# Patient Record
Sex: Male | Born: 1990 | Race: Black or African American | Hispanic: No | Marital: Single | Smoking: Heavy tobacco smoker
Health system: Southern US, Community
[De-identification: ages and names within clinical notes are randomized; demographics above are authoritative.]

---

## 2008-12-20 ENCOUNTER — Emergency Department: Payer: Self-pay | Admitting: Emergency Medicine

## 2009-03-08 ENCOUNTER — Emergency Department: Payer: Self-pay | Admitting: Emergency Medicine

## 2009-03-14 ENCOUNTER — Emergency Department: Payer: Self-pay | Admitting: Emergency Medicine

## 2010-04-06 ENCOUNTER — Emergency Department: Payer: Self-pay | Admitting: Emergency Medicine

## 2011-03-24 ENCOUNTER — Emergency Department: Payer: Self-pay | Admitting: Emergency Medicine

## 2012-04-30 ENCOUNTER — Emergency Department: Payer: Self-pay | Admitting: Emergency Medicine

## 2012-04-30 LAB — COMPREHENSIVE METABOLIC PANEL
Albumin: 4.5 g/dL (ref 3.4–5.0)
Alkaline Phosphatase: 87 U/L (ref 50–136)
Anion Gap: 8 (ref 7–16)
Bilirubin,Total: 3 mg/dL — ABNORMAL HIGH (ref 0.2–1.0)
Co2: 28 mmol/L (ref 21–32)
Creatinine: 0.97 mg/dL (ref 0.60–1.30)
EGFR (Non-African Amer.): >60
Osmolality: 276 (ref 275–301)
SGOT(AST): 19 U/L (ref 15–37)
SGPT (ALT): 17 U/L

## 2012-04-30 LAB — CBC
HGB: 15.6 g/dL (ref 13.0–18.0)
MCH: 27.5 pg (ref 26.0–34.0)
MCHC: 33.5 g/dL (ref 32.0–36.0)
MCV: 82 fL (ref 80–100)
RDW: 13.3 % (ref 11.5–14.5)
WBC: 15.2 10*3/uL — ABNORMAL HIGH (ref 3.8–10.6)

## 2012-04-30 LAB — URINALYSIS, COMPLETE
Bacteria: NONE SEEN
Bilirubin,UR: NEGATIVE
Glucose,UR: NEGATIVE mg/dL (ref 0–75)
Leukocyte Esterase: NEGATIVE
Nitrite: NEGATIVE
Ph: 5 (ref 4.5–8.0)
Protein: 30
RBC,UR: 6 /HPF (ref 0–5)
Specific Gravity: 1.032 (ref 1.003–1.030)
Squamous Epithelial: <1
WBC UR: NONE SEEN /HPF (ref 0–5)

## 2012-05-01 ENCOUNTER — Observation Stay: Payer: Self-pay | Admitting: Surgery

## 2012-05-01 LAB — COMPREHENSIVE METABOLIC PANEL
Anion Gap: 8 (ref 7–16)
BUN: 16 mg/dL (ref 7–18)
Bilirubin,Total: 2.3 mg/dL — ABNORMAL HIGH (ref 0.2–1.0)
Calcium, Total: 8.6 mg/dL (ref 8.5–10.1)
Chloride: 103 mmol/L (ref 98–107)
Co2: 28 mmol/L (ref 21–32)
EGFR (African American): >60
EGFR (Non-African Amer.): >60
Osmolality: 279 (ref 275–301)
Potassium: 3.6 mmol/L (ref 3.5–5.1)
SGOT(AST): 20 U/L (ref 15–37)
SGPT (ALT): 17 U/L
Total Protein: 7.3 g/dL (ref 6.4–8.2)

## 2012-05-01 LAB — URINALYSIS, COMPLETE
Bilirubin,UR: NEGATIVE
Blood: NEGATIVE
Glucose,UR: NEGATIVE mg/dL (ref 0–75)
Leukocyte Esterase: NEGATIVE
Nitrite: NEGATIVE
Ph: 6 (ref 4.5–8.0)
RBC,UR: 6 /HPF (ref 0–5)
Squamous Epithelial: <1
WBC UR: 2 /HPF (ref 0–5)

## 2012-05-01 LAB — CBC
HCT: 43 % (ref 40.0–52.0)
HGB: 14.4 g/dL (ref 13.0–18.0)
MCH: 27.3 pg (ref 26.0–34.0)
MCHC: 33.4 g/dL (ref 32.0–36.0)
MCV: 82 fL (ref 80–100)
Platelet: 149 10*3/uL — ABNORMAL LOW (ref 150–440)
RBC: 5.26 10*6/uL (ref 4.40–5.90)
WBC: 8 10*3/uL (ref 3.8–10.6)
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
## 2015-03-10 NOTE — H&P (Signed)
History of Present Illness 21 yobm was awoken with generalized dull mid-abdominal pain at 1030 yesterday AM. Worse after stepping and c/o bladder pressure after voiding. Vomited once last PM, seen in ER, and pain resolved with Zofran. Had diarrhea last PM and again this AM and c/o gas 'rumblings' and a mild H/A. Has had fever. Ate 2 slices of pizza at 3PM today and has no loss of appetite. CT last PM was 'overread' today as 16 mm appendix with stranding in R suprapubic region.   Past Med/Surgical Hx:  seasonal allergies:   denies:   ALLERGIES:  No Known Allergies:   HOME MEDICATIONS: Medication Instructions Status  ondansetron 4 mg oral tablet, disintegrating 1  orally every 8 hours as needed for nausea. 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, drives truck   Review of Systems:   Fever/Chills Yes    Cough No    Sputum No    Abdominal Pain Yes    Diarrhea Yes    Constipation No    Nausea/Vomiting Yes    SOB/DOE No    Chest Pain No    Dysuria Yes    Tolerating PT Yes    Tolerating Diet Yes    Medications/Allergies Reviewed Medications/Allergies reviewed   Physical Exam:   GEN well developed, well nourished, thin    HEENT pink conjunctivae, hearing intact to voice, moist oral mucosa, good dentition    NECK supple  No masses  trachea midline    RESP normal resp effort  clear BS  no use of accessory muscles    CARD regular rate  no murmur  no Rub    ABD positive tenderness  mild rebound and guarding in R suprapubic region    GU superpubic tenderness    EXTR negative cyanosis/clubbing, negative edema    SKIN No rashes, No ulcers, skin turgor good    NEURO cranial nerves intact, follows commands, motor/sensory function intact    PSYCH alert, A+O  to time, place, person, good insight   Radiology Results: CT:    15-Jun-13 23:27, CT Abdomen and Pelvis With Contrast   CT Abdomen and Pelvis With Contrast   REASON FOR EXAM:    (1) RLQ pain; (2) RLQ pain  COMMENTS:       PROCEDURE: CT  - CT ABDOMEN / PELVIS  W  - Apr 30 2012 11:27PM     RESULT: Axial CT scanning was performed through the abdomen and pelvis   with reconstructions at 3 mm intervals and slice thicknesses. Review of   multiplanar reconstructed images was performed separately on the VIA   monitor. The patient received oral contrast as well as 100 of Isovue 300.    The partially contrast-filled loops of small and large bowel exhibit no   evidence of ileus nor obstruction. There is contrast present within the   proximal portion of the appendix but there is dilation of the mid and   distal portions of the appendix consistent with acute appendicitis. This   is demonstrated on images 87-103. I do not see evidence of an abscess.     There is a small amount of free pelvic fluid.    The kidneys enhance well. There is no evidence of stones  or obstruction.   Along the expected course of the ureters I do not see abnormalities. The   partially distended urinary bladder is normal in appearance. The prostate   gland measures 4.7 cm in diameter is grossly normal in appearance. No   bladder stone or stone within the visualized portions of the urethra are   seen.    The liver, spleen, moderately distended stomach, gallbladder, pancreas,   adrenal glands, and periaortic and pericaval regions are normal in   appearance. The caliber of the abdominal aorta is normal.     The lung bases are clear. The lumbar vertebral bodies are preserved in   height.  IMPRESSION:   1. There are findings consistent with acute appendicitis. I do not see   evidence of abscess. There is a small amount of free fluid in the pelvis.   There is no evidence of bowel obstruction. Close clinical correlation and    surgical consultation is recommended.  2. I see no acute urinary tract abnormality nor acute hepatobiliary   abnormality.    A preliminary report was sent to the emergency department at the   conclusion of the study. This preliminary report did not suggest acute   appendicitis. Upon subsequent final dictation of this report at 6:40 a.m.   on 01 May 2012, this report was called by me to the emergency department   and the report given to Victorino Dike, RN, the ER charge nurse, who will have   the patient recalled.(*)      Verified By: DAVID A. [place], M.D., MD     Assessment/Admission Diagnosis Early acute appendicitis, possibly related to gastroenteritis    Plan Lap Appy   Electronic Signatures: Claude Manges (MD)  (Signed 16-Jun-13 17:02)  Authored: CHIEF COMPLAINT and HISTORY, PAST MEDICAL/SURGIAL HISTORY, ALLERGIES, HOME MEDICATIONS, FAMILY AND SOCIAL HISTORY, REVIEW OF SYSTEMS, PHYSICAL EXAM, Radiology, ASSESSMENT AND PLAN   Last Updated: 16-Jun-13 17:02 by Claude Manges (MD)

## 2015-03-10 NOTE — Discharge Summary (Signed)
PATIENT NAME:  Lee Silva, Lee Silva MR#:  [id number] DATE OF BIRTH:  [birth year]  DATE OF ADMISSION:  05/01/2012 DATE OF DISCHARGE:  05/02/2012  FINAL DIAGNOSIS: Abdominal pain, gastroenteritis likely resolved.   PRINCIPLE PROCEDURE: CT scan of the abdomen and pelvis.   HOSPITAL COURSE SUMMARY: The patient initially was felt to have acute appendicitis. He did eat at 3 o'clock the day of admission and this delayed his surgery until later that evening. Upon evaluation of the patient, the patient was in good spirits, was having no abdominal pain and was having only mild amount of pain in the right lower quadrant. As such, his surgery was delayed until the next morning. Upon re-evaluation, the patient was absolutely symptom-free. He had eaten three medium pizzas the night before. Examination was unremarkable. As such, the patient was discharged home from the PAC-U without intervention being performed in the form of laparoscopic appendectomy. He was instructed follow-up with Dr. Michela PitcherEly tomorrow in the office.   ____________________________ Redge GainerMark A. Egbert GaribaldiBird, MD mab:drc D: 05/05/2012 23:21:24 ET T: 05/06/2012 12:04:42 ET JOB#: [id number]  cc: Loraine LericheMark A. Egbert GaribaldiBird, MD, <Dictator> Raynald KempMARK A Teighlor Korson MD ELECTRONICALLY SIGNED 05/13/2012 17:59

## 2016-06-26 ENCOUNTER — Encounter: Payer: Self-pay | Admitting: Emergency Medicine

## 2016-06-26 ENCOUNTER — Emergency Department: Payer: No Typology Code available for payment source

## 2016-06-26 ENCOUNTER — Emergency Department
Admission: EM | Admit: 2016-06-26 | Discharge: 2016-06-26 | Disposition: A | Discharge status: Home / Self Care | Payer: No Typology Code available for payment source | Attending: Emergency Medicine | Admitting: Emergency Medicine

## 2016-06-26 DIAGNOSIS — M791 Myalgia: Secondary | ICD-10-CM | POA: Insufficient documentation

## 2016-06-26 DIAGNOSIS — M7918 Myalgia, other site: Secondary | ICD-10-CM

## 2016-06-26 DIAGNOSIS — M25561 Pain in right knee: Secondary | ICD-10-CM | POA: Diagnosis present

## 2016-06-26 DIAGNOSIS — Y92410 Unspecified street and highway as the place of occurrence of the external cause: Secondary | ICD-10-CM | POA: Insufficient documentation

## 2016-06-26 DIAGNOSIS — Y9389 Activity, other specified: Secondary | ICD-10-CM | POA: Diagnosis not present

## 2016-06-26 DIAGNOSIS — Y999 Unspecified external cause status: Secondary | ICD-10-CM | POA: Diagnosis not present

## 2016-06-26 MED ORDER — IBUPROFEN 800 MG PO TABS: 800.0000 mg | ORAL_TABLET | Freq: Three times a day (TID) | ORAL | 0 refills | Status: DC | PRN
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
## 2016-07-12 ENCOUNTER — Emergency Department
Admission: EM | Admit: 2016-07-12 | Discharge: 2016-07-12 | Disposition: A | Discharge status: Home / Self Care | Payer: No Typology Code available for payment source | Attending: Emergency Medicine | Admitting: Emergency Medicine

## 2016-07-12 DIAGNOSIS — H6123 Impacted cerumen, bilateral: Secondary | ICD-10-CM

## 2016-07-12 DIAGNOSIS — F17200 Nicotine dependence, unspecified, uncomplicated: Secondary | ICD-10-CM | POA: Diagnosis not present

## 2016-07-12 DIAGNOSIS — S8001XD Contusion of right knee, subsequent encounter: Secondary | ICD-10-CM | POA: Insufficient documentation

## 2016-07-12 DIAGNOSIS — M25561 Pain in right knee: Secondary | ICD-10-CM

## 2016-07-12 MED ORDER — CYCLOBENZAPRINE HCL 5 MG PO TABS: 5.0000 mg | ORAL_TABLET | Freq: Three times a day (TID) | ORAL | 0 refills | Status: DC | PRN

## 2016-07-12 MED ORDER — CARBAMIDE PEROXIDE 6.5 % OT SOLN
5.0000 [drp] | Freq: Once | OTIC | Status: AC
  Administered 2016-07-12: 5 [drp] via OTIC
  Filled 2016-07-12: qty 15

## 2016-07-12 MED ORDER — IBUPROFEN 800 MG PO TABS: 800.0000 mg | ORAL_TABLET | Freq: Three times a day (TID) | ORAL | 0 refills | Status: DC | PRN

## 2016-07-12 NOTE — Discharge Instructions (Signed)
Your knee exam does not show any signs of internal tears or tendon ruptures. You should continue the ibuprofen and Tylenol as needed for pain. Follow-up with one of the local community clinics or Dr. Martha ClanKrasinski as needed for ongoing symptoms. Apply ice to reduce symptoms. Return to the ED for severely worsening symptoms.
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
## 2018-02-19 ENCOUNTER — Encounter: Payer: Self-pay | Admitting: Emergency Medicine

## 2018-02-19 ENCOUNTER — Other Ambulatory Visit: Payer: Self-pay

## 2018-02-19 ENCOUNTER — Emergency Department: Payer: Self-pay

## 2018-02-19 ENCOUNTER — Emergency Department
Admission: EM | Admit: 2018-02-19 | Discharge: 2018-02-19 | Disposition: A | Discharge status: Home / Self Care | Payer: Self-pay | Attending: Internal Medicine | Admitting: Internal Medicine

## 2018-02-19 DIAGNOSIS — Z79899 Other long term (current) drug therapy: Secondary | ICD-10-CM | POA: Insufficient documentation

## 2018-02-19 DIAGNOSIS — Y999 Unspecified external cause status: Secondary | ICD-10-CM | POA: Insufficient documentation

## 2018-02-19 DIAGNOSIS — F17200 Nicotine dependence, unspecified, uncomplicated: Secondary | ICD-10-CM | POA: Insufficient documentation

## 2018-02-19 DIAGNOSIS — Y929 Unspecified place or not applicable: Secondary | ICD-10-CM | POA: Insufficient documentation

## 2018-02-19 DIAGNOSIS — Y93E6 Activity, residential relocation: Secondary | ICD-10-CM | POA: Insufficient documentation

## 2018-02-19 DIAGNOSIS — W228XXA Striking against or struck by other objects, initial encounter: Secondary | ICD-10-CM | POA: Insufficient documentation

## 2018-02-19 DIAGNOSIS — S8001XA Contusion of right knee, initial encounter: Secondary | ICD-10-CM | POA: Insufficient documentation

## 2018-02-19 DIAGNOSIS — S80211A Abrasion, right knee, initial encounter: Secondary | ICD-10-CM | POA: Insufficient documentation

## 2018-02-19 MED ORDER — CYCLOBENZAPRINE HCL 5 MG PO TABS: 5.0000 mg | ORAL_TABLET | Freq: Three times a day (TID) | ORAL | 0 refills | Status: DC | PRN

## 2018-02-19 MED ORDER — BACITRACIN ZINC 500 UNIT/GM EX OINT
TOPICAL_OINTMENT | CUTANEOUS | Status: AC
  Filled 2018-02-19: qty 0.9

## 2018-02-19 MED ORDER — IBUPROFEN 800 MG PO TABS
800.0000 mg | ORAL_TABLET | Freq: Once | ORAL | Status: AC
  Administered 2018-02-19: 800 mg via ORAL
  Filled 2018-02-19: qty 1

## 2018-02-19 MED ORDER — BACITRACIN ZINC 500 UNIT/GM EX OINT
TOPICAL_OINTMENT | Freq: Once | CUTANEOUS | Status: AC
  Administered 2018-02-19: 1 via TOPICAL
  Filled 2018-02-19: qty 0.9

## 2018-02-19 NOTE — ED Triage Notes (Signed)
Metal rack fell on R knee couple hours ago. Painful since.

## 2018-02-19 NOTE — Discharge Instructions (Signed)
Your exam and x-ray are negative for fracture or dislocation. You have a knee contusion with abrasions. Keep the wounds clean and covered with antibiotic. Take OTC ibuprofen for non-drowsy pain relief. Rest with leg elevated and apply ice to reduce swelling.

## 2018-02-19 NOTE — ED Provider Notes (Signed)
[facility] Emergency Department Provider Note ____________________________________________  Time seen: 1231  I have reviewed the triage vital signs and the nursing notes.  HISTORY  Chief Complaint  Knee Pain  HPI Lee Silva is a 27 y.o. male resents to the ED for pain to the right knee after a contusion.  Patient describes he was helping a friend move some furniture when a large metal rack fell across his right knee a few hours prior to arrival.  He presents with pain and tenderness as well as some abrasions to the proximal aspect of the knee.  He denies any other injury at this time.  He reports pain is increased with flexion of the knee due to the contusion over the quadriceps muscle.  History reviewed. No pertinent past medical history.  There are no active problems to display for this patient.   History reviewed. No pertinent surgical history.  Prior to Admission medications   Medication Sig Start Date End Date Taking? Authorizing Provider  cyclobenzaprine (FLEXERIL) 5 MG tablet Take 1 tablet (5 mg total) by mouth 3 (three) times daily as needed for muscle spasms. 02/19/18   Homero Hyson, Charlesetta Ivory, PA-C  ibuprofen (ADVIL,MOTRIN) 800 MG tablet Take 1 tablet (800 mg total) by mouth every 8 (eight) hours as needed. 06/26/16   Beers, Charmayne Sheer, PA-C  ibuprofen (ADVIL,MOTRIN) 800 MG tablet Take 1 tablet (800 mg total) by mouth every 8 (eight) hours as needed. 07/12/16   Iliani Vejar, Charlesetta Ivory, PA-C    Allergies Patient has no known allergies.  No family history on file.  Social History Social History   Tobacco Use  . Smoking status: Heavy Tobacco Smoker    Packs/day: 1.00    Years: 6.00    Pack years: 6.00  . Smokeless tobacco: Never Used  Substance Use Topics  . Alcohol use: No  . Drug use: No    Review of Systems  Constitutional: Negative for fever. Cardiovascular: Negative for chest pain. Respiratory: Negative for shortness of  breath. Musculoskeletal: Negative for back pain.  Right knee pain as above. Skin: Negative for rash. Neurological: Negative for headaches, focal weakness or numbness. ____________________________________________  PHYSICAL EXAM:  VITAL SIGNS: ED Triage Vitals  Enc Vitals Group     BP 02/19/18 1120 (!) 104/44     Pulse Rate 02/19/18 1120 74     Resp 02/19/18 1120 20     Temp 02/19/18 1120 98.3 F (36.8 C)     Temp Source 02/19/18 1120 Oral     SpO2 02/19/18 1120 98 %     Weight 02/19/18 1121 150 lb (68 kg)     Height 02/19/18 1121 6' (1.829 m)     Head Circumference --      Peak Flow --      Pain Score 02/19/18 1121 3     Pain Loc --      Pain Edu? --      Excl. in GC? --     Constitutional: Alert and oriented. Well appearing and in no distress. Head: Normocephalic and atraumatic. Cardiovascular: Normal rate, regular rhythm. Normal distal pulses. Respiratory: Normal respiratory effort. No wheezes/rales/rhonchi. Musculoskeletal: Right knee without obvious deformity, dislocation, or effusion.  The abrasions are noted to the superior pole of the kneecap at the quadriceps musculature.  The patient with normal flexion & extension range, limited only secondary to some tenderness across the distal quadricep muscle.  No valgus or varus joint stress is appreciated.  The patella tracks  normally without laxity or ballottement.  No popliteal space fullness is noted.  Nontender with normal range of motion in all other extremities.  Neurologic: Antalgic gait without ataxia. Normal speech and language. No gross focal neurologic deficits are appreciated. Skin:  Skin is warm, dry and intact. No rash noted.  Abrasions as noted above. ___________________________________________   RADIOLOGY  Right Knee  Negative  ____________________________________________  PROCEDURES  IBU 800 mg PO Wounds cleaned and dressed antibiotic ointment  Telfa.  Ace bandage is applied for  support. ____________________________________________  INITIAL IMPRESSION / ASSESSMENT AND PLAN / ED COURSE  Patient with ED evaluation right knee contusion and abrasion.  He is reassured by his negative x-ray.  Exam is consistent with some soft tissue pain over the proximal thigh and superior knee.  Patient is discharged with a prescription for Flexeril and a work note is provided for 2 days as requested. ____________________________________________  FINAL CLINICAL IMPRESSION(S) / ED DIAGNOSES  Final diagnoses:  Contusion of right knee, initial encounter  Knee abrasion, right, initial encounter      Lissa HoardMenshew, Ryiah Bellissimo V Bacon, PA-C 02/19/18 1920    Emily FilbertWilliams, Jonathan E, MD 02/22/18 [phone]

## 2018-07-11 ENCOUNTER — Encounter: Payer: Self-pay | Admitting: Emergency Medicine

## 2018-07-11 ENCOUNTER — Emergency Department: Payer: No Typology Code available for payment source

## 2018-07-11 ENCOUNTER — Emergency Department
Admission: EM | Admit: 2018-07-11 | Discharge: 2018-07-11 | Disposition: A | Discharge status: Home / Self Care | Payer: No Typology Code available for payment source | Attending: Emergency Medicine | Admitting: Emergency Medicine

## 2018-07-11 DIAGNOSIS — M25562 Pain in left knee: Secondary | ICD-10-CM | POA: Insufficient documentation

## 2018-07-11 DIAGNOSIS — F17210 Nicotine dependence, cigarettes, uncomplicated: Secondary | ICD-10-CM | POA: Insufficient documentation

## 2018-07-11 DIAGNOSIS — Z4802 Encounter for removal of sutures: Secondary | ICD-10-CM | POA: Diagnosis present

## 2018-07-11 DIAGNOSIS — S8002XA Contusion of left knee, initial encounter: Secondary | ICD-10-CM

## 2018-07-11 MED ORDER — BACITRACIN ZINC 500 UNIT/GM EX OINT
TOPICAL_OINTMENT | Freq: Once | CUTANEOUS | Status: AC
  Administered 2018-07-11: 1 via TOPICAL

## 2018-07-11 MED ORDER — NABUMETONE 750 MG PO TABS: 750.0000 mg | ORAL_TABLET | Freq: Two times a day (BID) | ORAL | 0 refills | Status: AC

## 2018-07-11 MED ORDER — CYCLOBENZAPRINE HCL 5 MG PO TABS
5.0000 mg | ORAL_TABLET | Freq: Three times a day (TID) | ORAL | 0 refills | Status: AC | PRN
Start: 2018-07-11 — End: ?
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
## 2019-09-28 ENCOUNTER — Telehealth: Payer: Self-pay | Admitting: Family Medicine

## 2019-09-28 NOTE — Telephone Encounter (Signed)
Pls call me to set up my monthly meds visits

## 2020-03-21 IMAGING — DX DG KNEE COMPLETE 4+V*R*
4 series · 4 of 4 positions shown · non-contrast
Comparison: 06/26/2016

CLINICAL DATA: "[place]" fell onto pt's right knee, multiple
lacerations right knee, painful to full extend or flex.

EXAM:
RIGHT KNEE - COMPLETE 4+ VIEW

[knee ap]
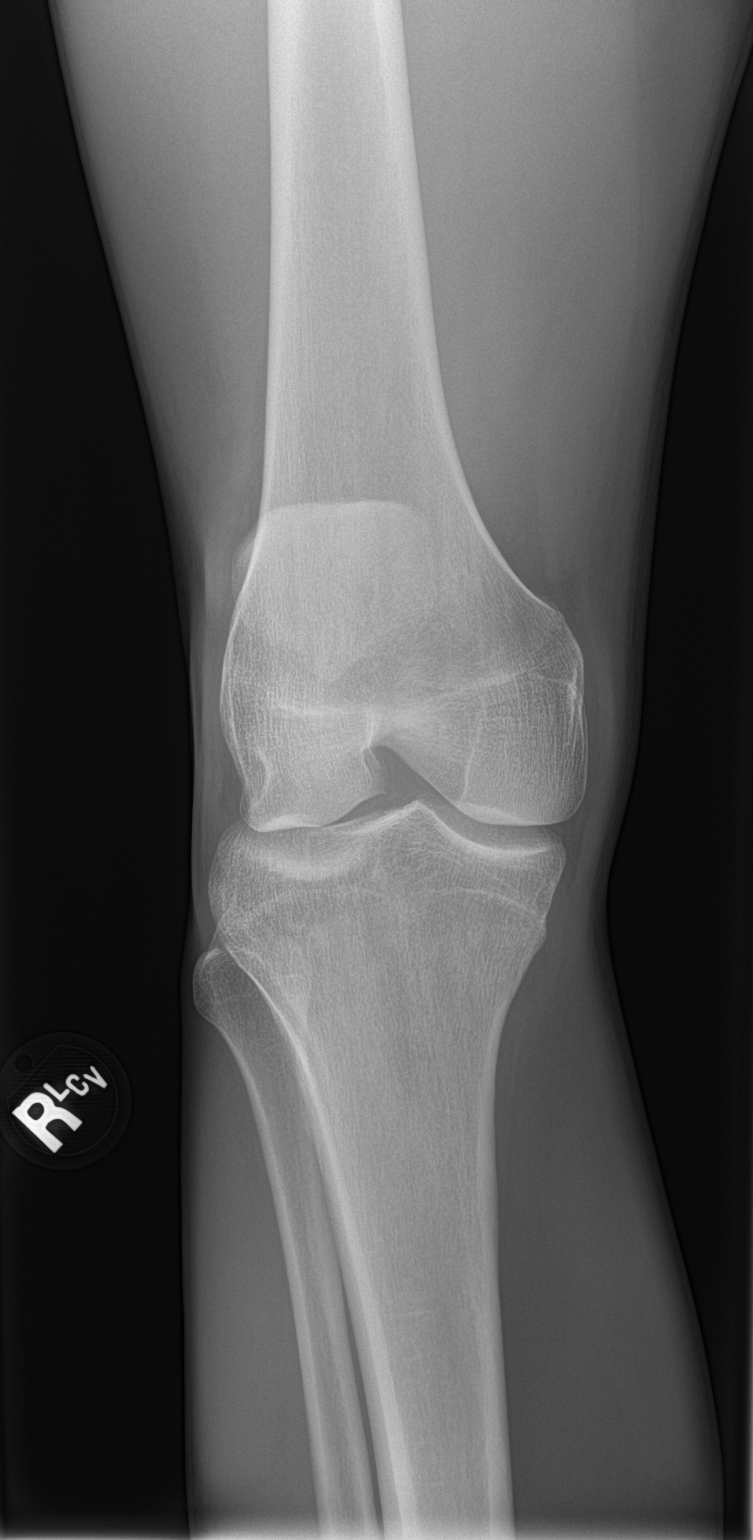

[knee lat]
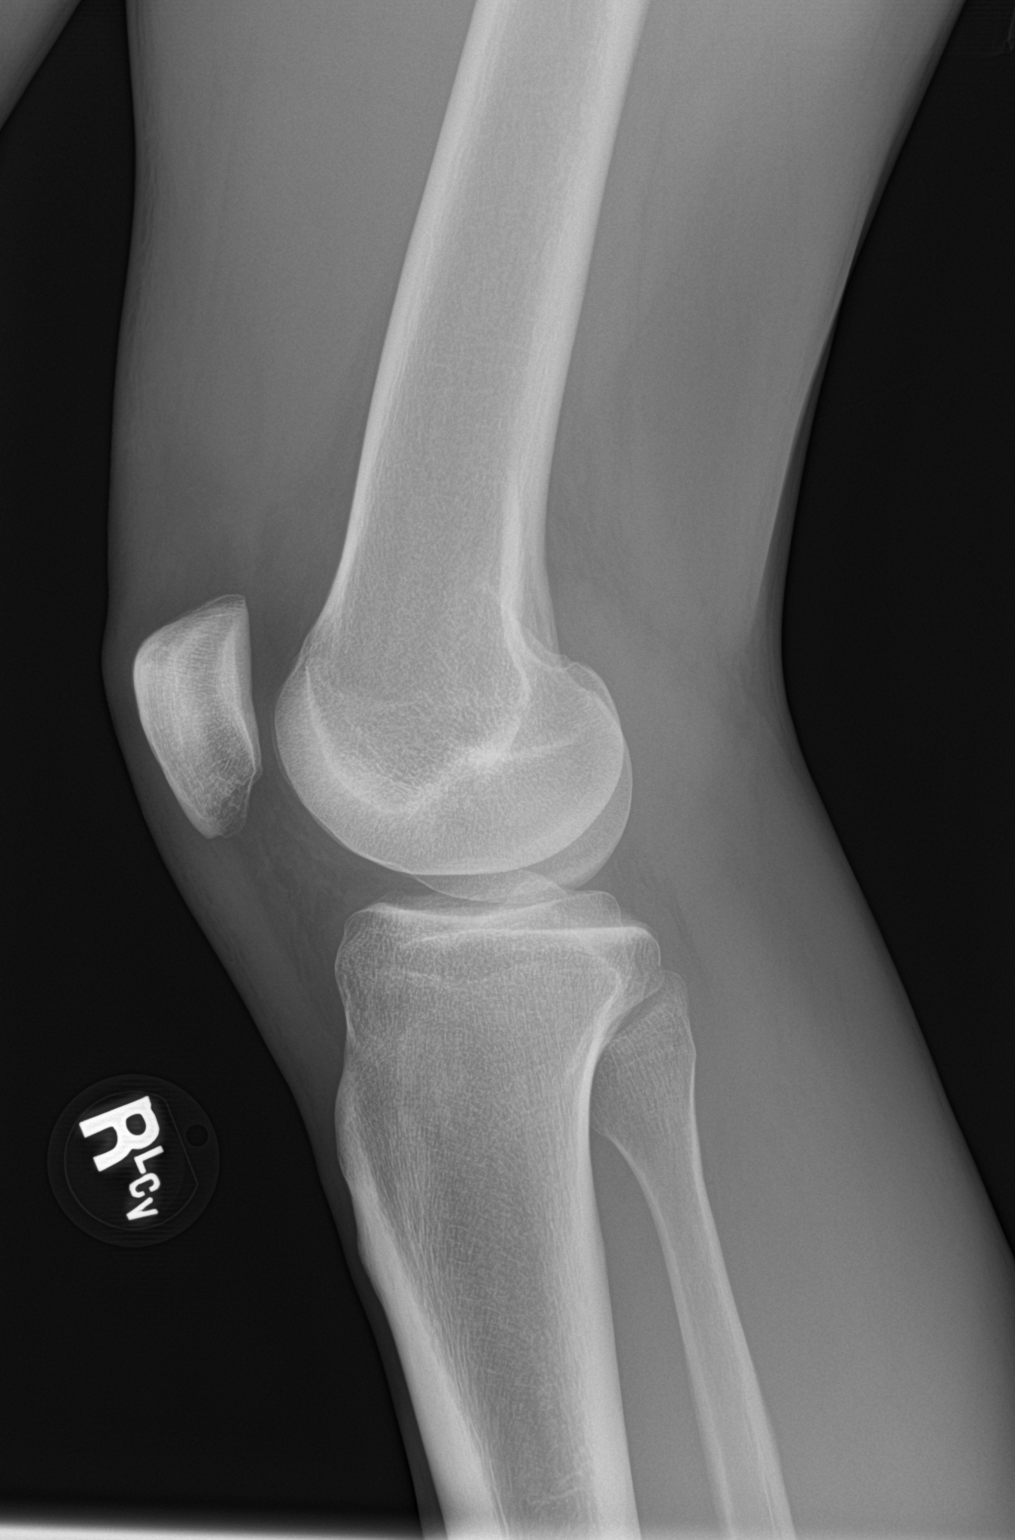

[knee obl (1 of 2)]
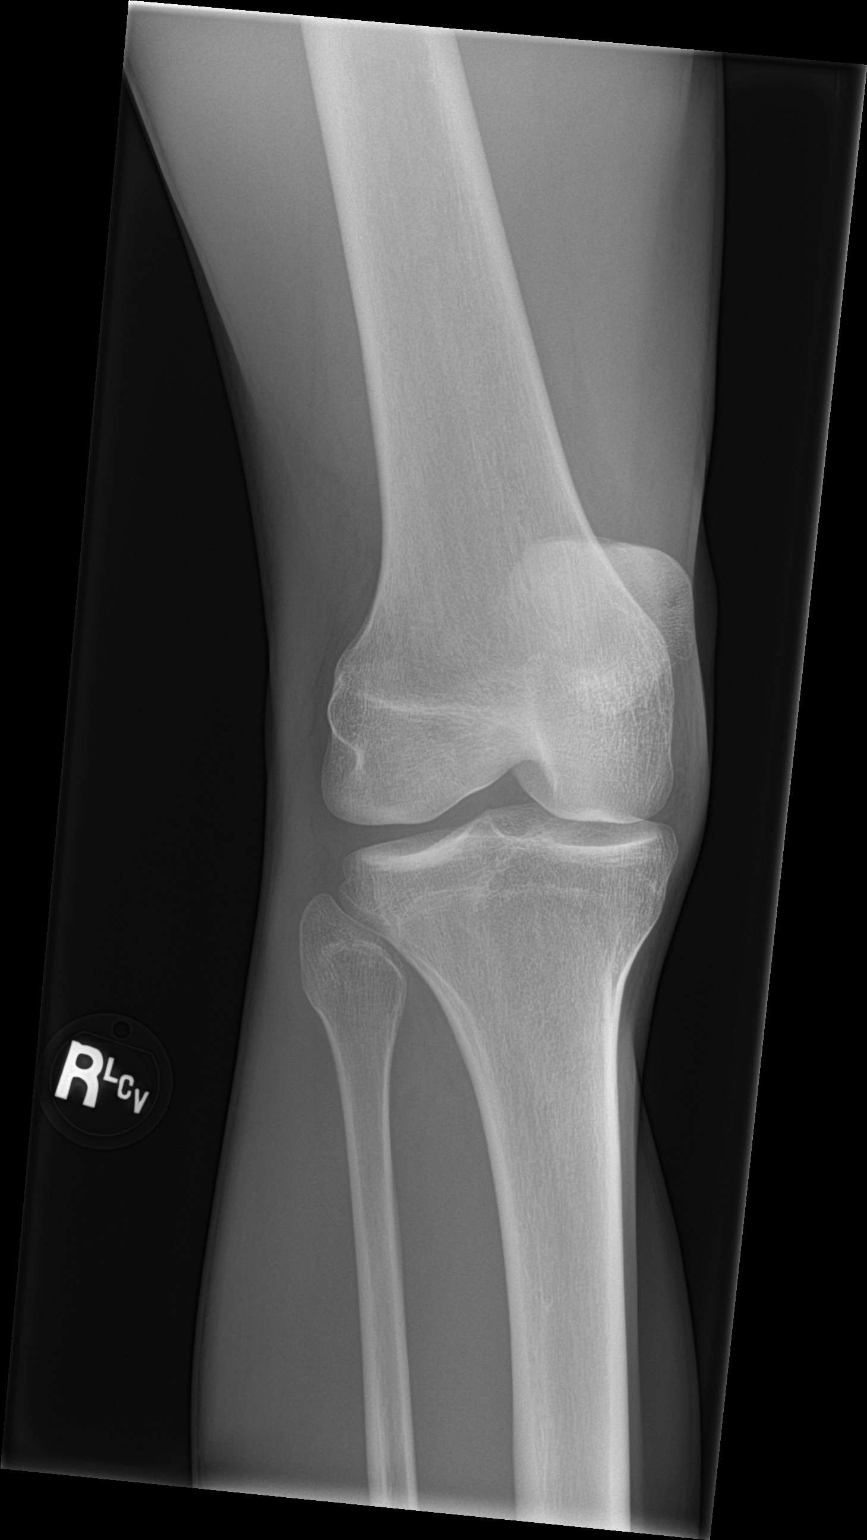

[knee obl (2 of 2)]
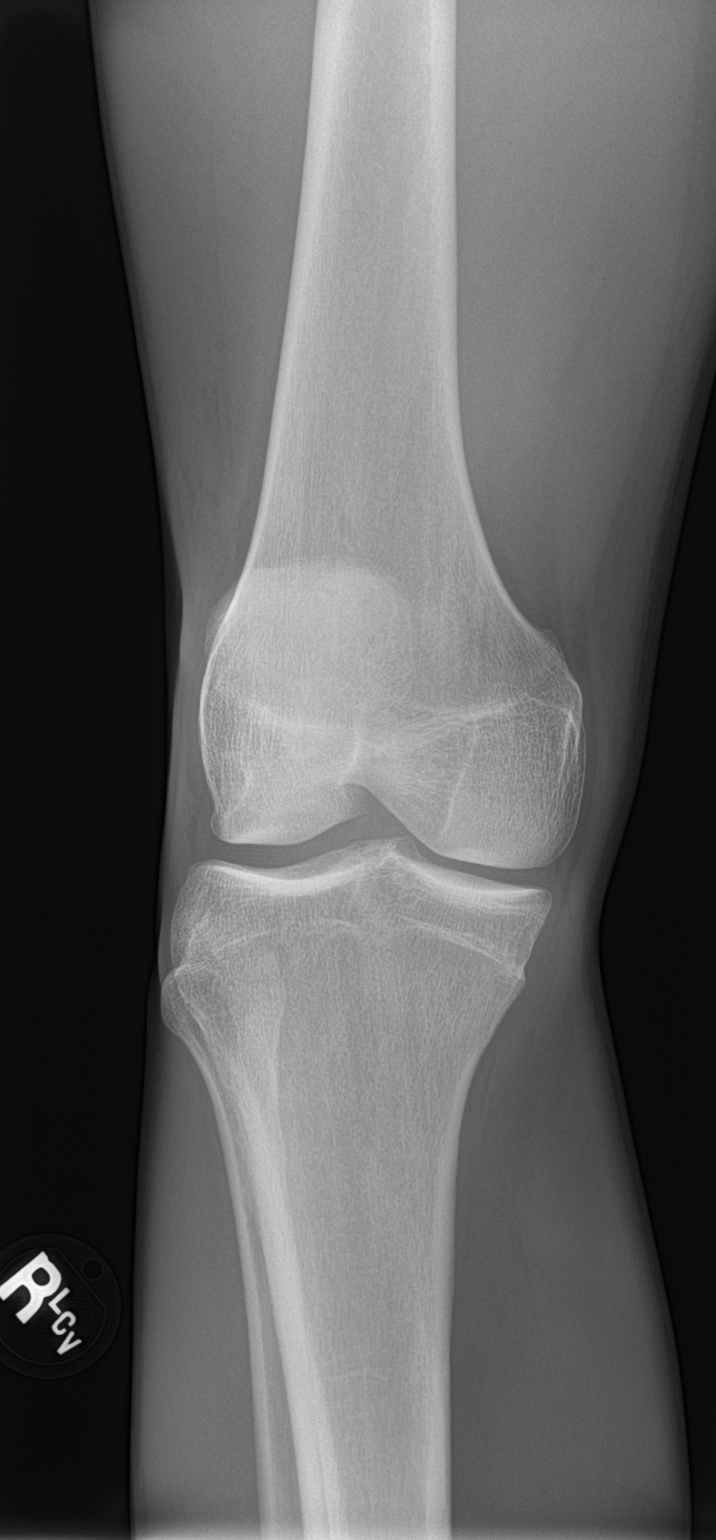

[4 of 4 positions shown; findings below may reference images not displayed]

FINDINGS: No fracture.  No bone lesion.

The knee joint is normally spaced and aligned. No arthropathic
changes.

No convincing joint effusion.

There is mild anterior subcutaneous soft tissue edema. No radiopaque
foreign bodies.
IMPRESSION: No fracture, joint abnormality or radiopaque foreign body
